# Patient Record
Sex: Female | Born: 1975 | Race: Black or African American | Hispanic: No | Marital: Single | Smoking: Never smoker
Health system: Southern US, Community
[De-identification: ages and names within clinical notes are randomized; demographics above are authoritative.]

## PROBLEM LIST (undated history)

## (undated) DIAGNOSIS — Z87440 Personal history of urinary (tract) infections: Secondary | ICD-10-CM

## (undated) DIAGNOSIS — G43909 Migraine, unspecified, not intractable, without status migrainosus: Secondary | ICD-10-CM

## (undated) DIAGNOSIS — Z862 Personal history of diseases of the blood and blood-forming organs and certain disorders involving the immune mechanism: Secondary | ICD-10-CM

## (undated) DIAGNOSIS — Z8742 Personal history of other diseases of the female genital tract: Secondary | ICD-10-CM

## (undated) DIAGNOSIS — Z8759 Personal history of other complications of pregnancy, childbirth and the puerperium: Secondary | ICD-10-CM

## (undated) HISTORY — DX: Personal history of urinary (tract) infections: Z87.440

## (undated) HISTORY — DX: Personal history of diseases of the blood and blood-forming organs and certain disorders involving the immune mechanism: Z86.2

## (undated) HISTORY — DX: Migraine, unspecified, not intractable, without status migrainosus: G43.909

## (undated) HISTORY — DX: Personal history of other complications of pregnancy, childbirth and the puerperium: Z87.59

## (undated) HISTORY — DX: Personal history of other diseases of the female genital tract: Z87.42

## (undated) HISTORY — PX: OTHER SURGICAL HISTORY: SHX169

---

## 1996-07-14 DIAGNOSIS — Z87448 Personal history of other diseases of urinary system: Secondary | ICD-10-CM | POA: Insufficient documentation

## 2007-11-27 ENCOUNTER — Emergency Department: Payer: Self-pay | Admitting: Emergency Medicine

## 2009-01-17 IMAGING — CR RIGHT HAND - COMPLETE 3+ VIEW
1 series · 3 of 3 positions shown · non-contrast
Comparison: No comparison

REASON FOR EXAM: injury  thumb   MC 1
COMMENTS:   LMP: depo

PROCEDURE:     DXR - DXR HAND RT COMPLETE W/OBLIQUES  - November 27, 2007 [DATE]
RESULT:     History: Thumb injury

[Series 1: view not recorded · 0.17mm/px · 3 of 3 slices shown]
[im 1/3]
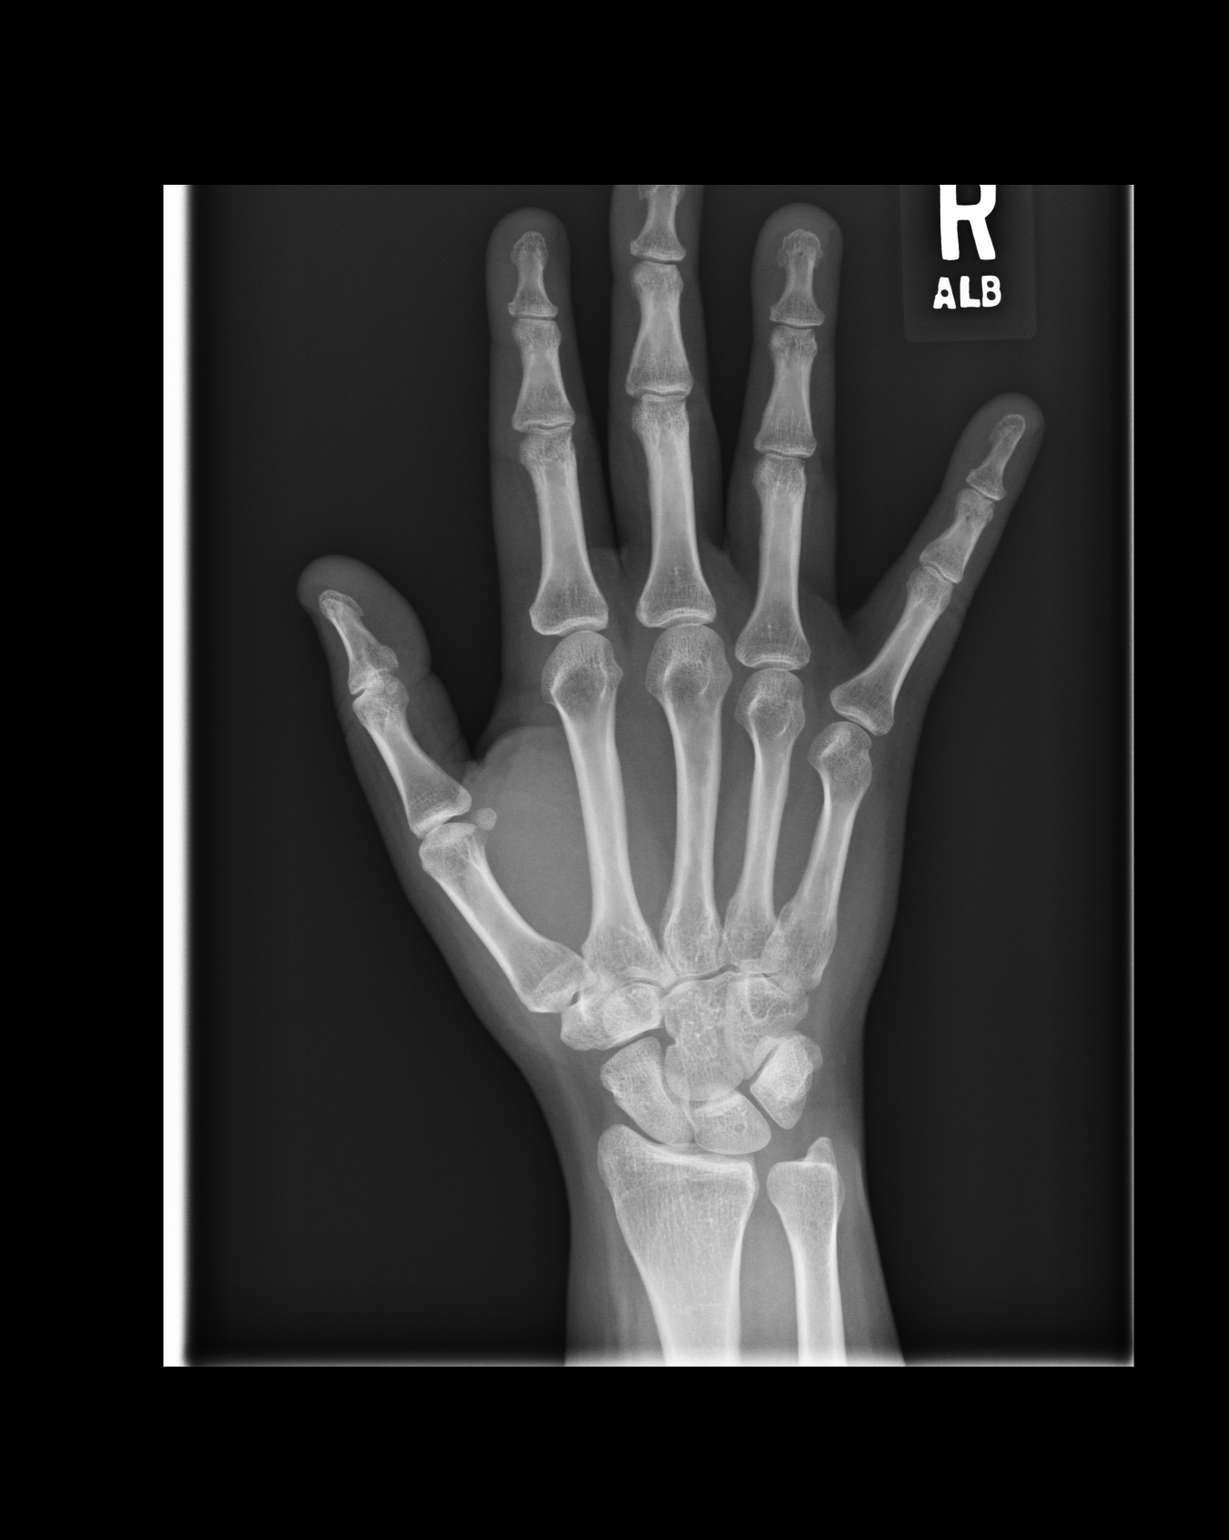
[im 2/3]
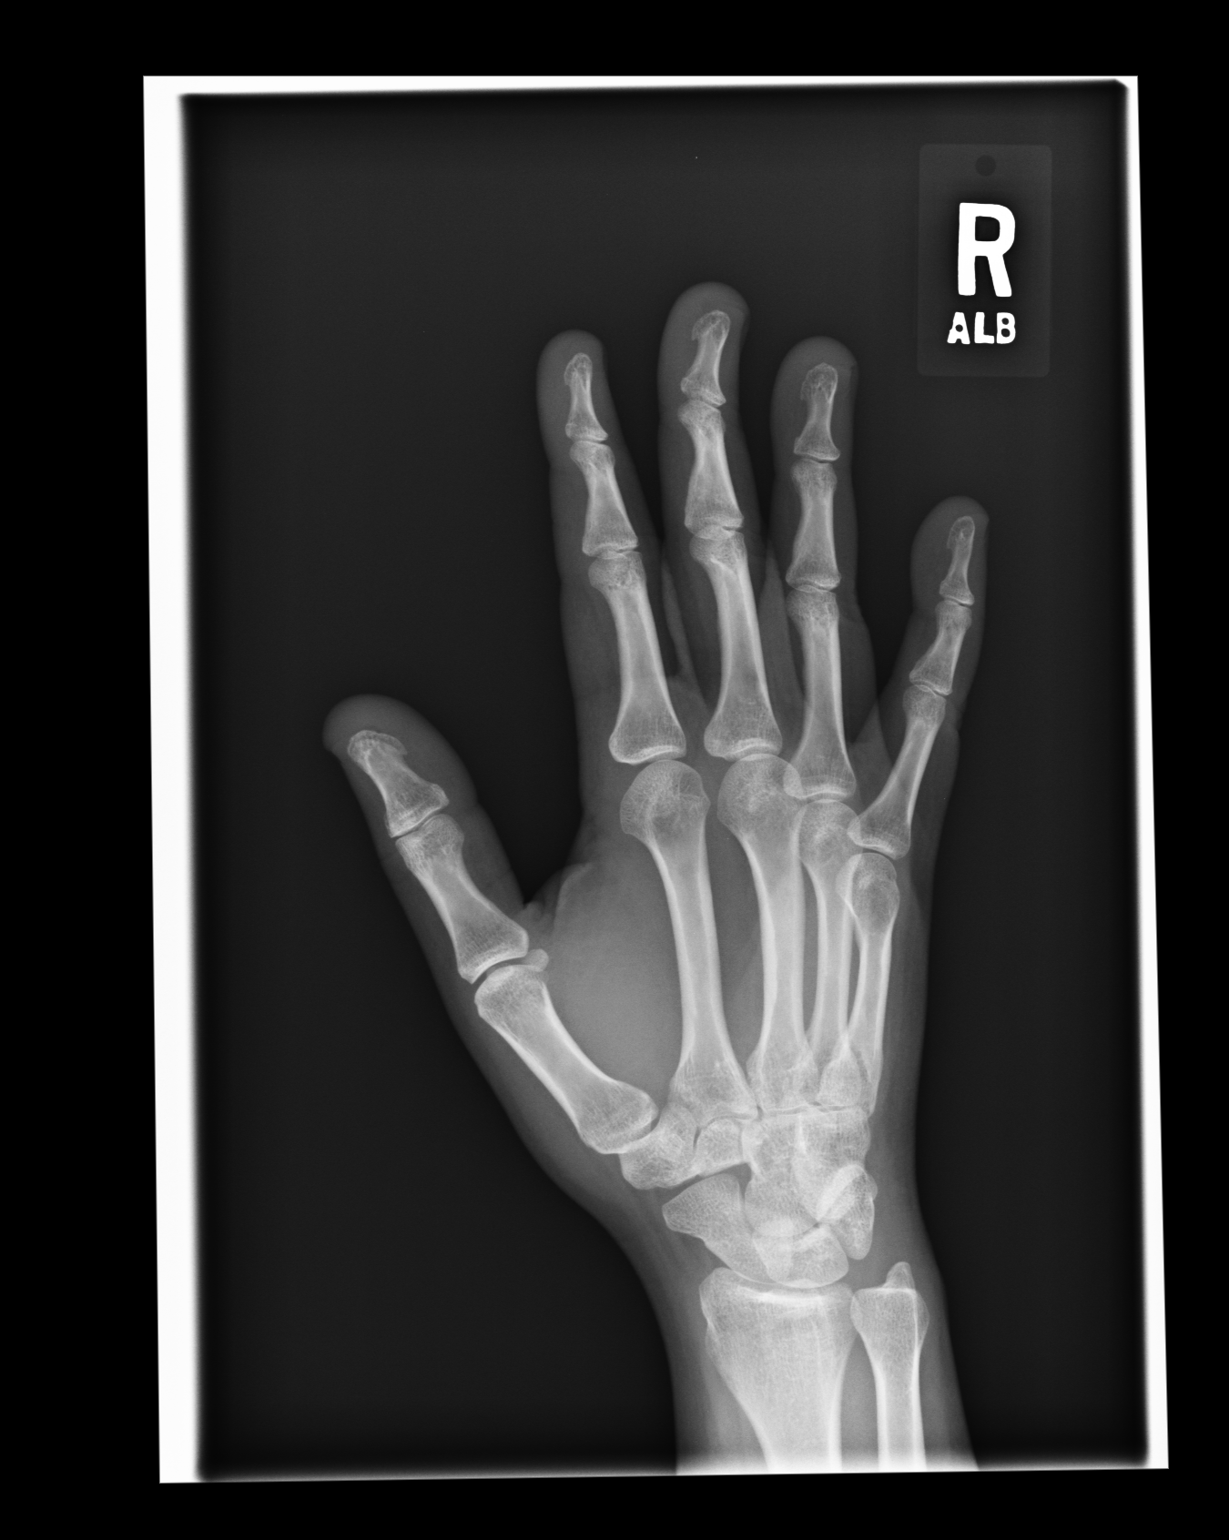
[im 3/3]
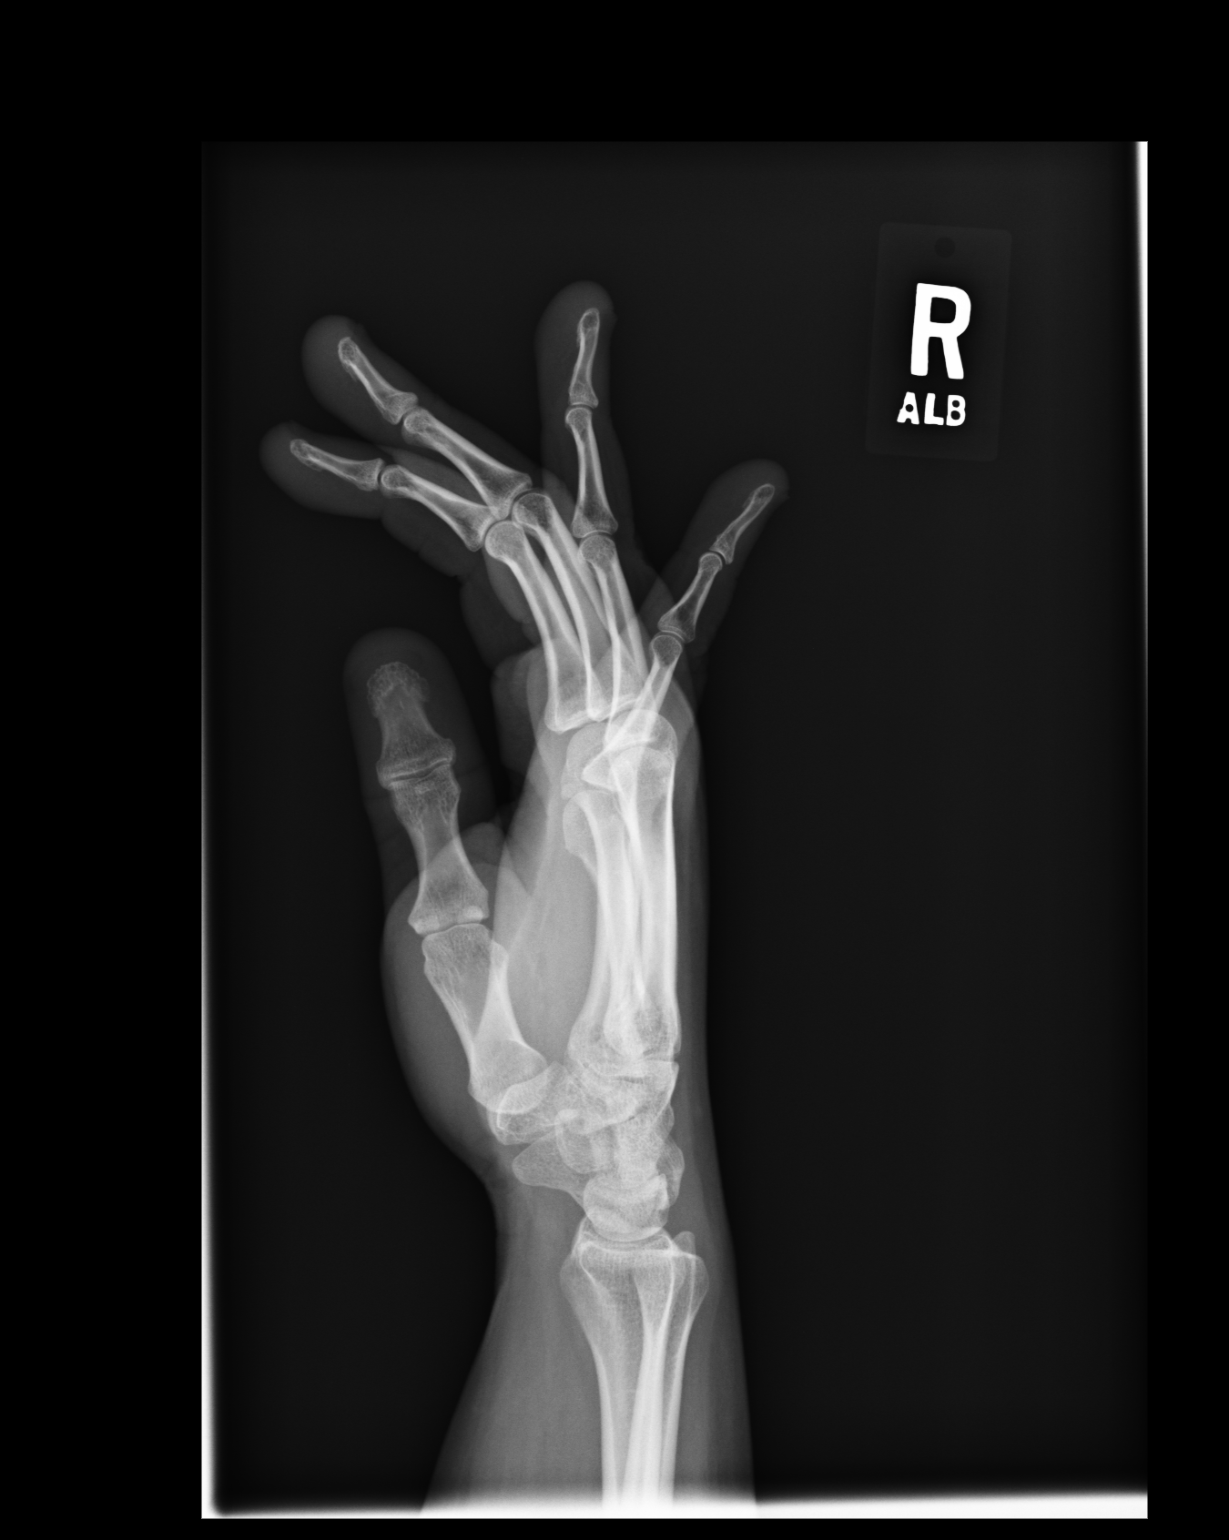

[3 of 3 positions shown; findings below may reference images not displayed]

FINDINGS: AP, oblique, and lateral views of the right hand demonstrates no fracture or
dislocation. There is normal bone mineralization. There are no erosive
changes. The joint spaces are maintained. There is no soft tissue swelling.
IMPRESSION: No acute osseous abnormality of the right hand.

## 2011-12-31 ENCOUNTER — Emergency Department: Payer: Self-pay | Admitting: Emergency Medicine

## 2011-12-31 LAB — URINALYSIS, COMPLETE
Bilirubin,UR: NEGATIVE
Blood: NEGATIVE
Ketone: NEGATIVE
Nitrite: NEGATIVE
Ph: 5 (ref 4.5–8.0)
RBC,UR: 2 /HPF (ref 0–5)

## 2011-12-31 LAB — COMPREHENSIVE METABOLIC PANEL
Albumin: 3.8 g/dL (ref 3.4–5.0)
Alkaline Phosphatase: 63 U/L (ref 50–136)
BUN: 11 mg/dL (ref 7–18)
Calcium, Total: 8.8 mg/dL (ref 8.5–10.1)
EGFR (Non-African Amer.): >60
Glucose: 57 mg/dL — ABNORMAL LOW (ref 65–99)
Osmolality: 280 (ref 275–301)
Potassium: 3.6 mmol/L (ref 3.5–5.1)
SGOT(AST): 19 U/L (ref 15–37)
SGPT (ALT): 16 U/L (ref 12–78)

## 2011-12-31 LAB — CBC
HCT: 42.1 % (ref 35.0–47.0)
MCH: 30.5 pg (ref 26.0–34.0)
MCHC: 33.8 g/dL (ref 32.0–36.0)
MCV: 90 fL (ref 80–100)
Platelet: 268 10*3/uL (ref 150–440)
RBC: 4.66 10*6/uL (ref 3.80–5.20)
RDW: 13.7 % (ref 11.5–14.5)
WBC: 3.5 10*3/uL — ABNORMAL LOW (ref 3.6–11.0)

## 2011-12-31 LAB — PREGNANCY, URINE: Pregnancy Test, Urine: NEGATIVE m[IU]/mL

## 2011-12-31 LAB — WET PREP, GENITAL

## 2012-01-01 LAB — URINE CULTURE

## 2014-02-13 ENCOUNTER — Emergency Department: Payer: Self-pay | Admitting: Emergency Medicine

## 2016-06-23 DIAGNOSIS — G43909 Migraine, unspecified, not intractable, without status migrainosus: Secondary | ICD-10-CM

## 2016-06-23 HISTORY — DX: Migraine, unspecified, not intractable, without status migrainosus: G43.909

## 2016-06-25 LAB — HM PAP SMEAR: HM PAP: NEGATIVE

## 2018-02-02 DIAGNOSIS — R030 Elevated blood-pressure reading, without diagnosis of hypertension: Secondary | ICD-10-CM | POA: Insufficient documentation

## 2018-02-02 DIAGNOSIS — E669 Obesity, unspecified: Secondary | ICD-10-CM | POA: Insufficient documentation

## 2018-02-02 LAB — HM HIV SCREENING LAB: HM HIV Screening: NEGATIVE

## 2018-06-04 DIAGNOSIS — Z6838 Body mass index (BMI) 38.0-38.9, adult: Secondary | ICD-10-CM

## 2018-06-04 DIAGNOSIS — E669 Obesity, unspecified: Secondary | ICD-10-CM

## 2018-06-04 DIAGNOSIS — Z87448 Personal history of other diseases of urinary system: Secondary | ICD-10-CM

## 2018-06-04 DIAGNOSIS — R030 Elevated blood-pressure reading, without diagnosis of hypertension: Secondary | ICD-10-CM

## 2018-10-25 ENCOUNTER — Telehealth: Payer: Self-pay | Admitting: Family Medicine

## 2018-10-26 NOTE — Telephone Encounter (Signed)
Returned patient phone call. Spoke with patient which is requesting to come to health department to "pick up more birth control patches." RN informed patient that we did not have in house birth control patches but that a provider would be able to write a Rx for patches. Patient informed RN that she had a current prescription with refills available. Patient stated she would continue with current birth control patch order. No further questions.

## 2019-02-08 ENCOUNTER — Other Ambulatory Visit: Payer: Self-pay | Admitting: Physician Assistant

## 2019-02-08 ENCOUNTER — Telehealth: Payer: Self-pay | Admitting: Family Medicine

## 2019-02-08 NOTE — Telephone Encounter (Signed)
RN consulted re: patient request for refill on Xulane patch.  Reviewed RN note and will call in [place] for patient with refills x 3.  Patient to RTC for RP/in person visit prior to refills running out.

## 2019-02-08 NOTE — Telephone Encounter (Signed)
Phone call to pt at phone # provided. Pt reports that she was here back in 05/2018 where they prescribed her the birth control patch. Pt reports that she had been skipping the patch free week so that she wouldn't have her periods as she gets anemic, and needs another rx as she is on her last patch. Pt reports she is not having any issues with the birth control patch and reports [facility] [address] in [place], [place]. Counseled pt I would discuss with the provider and give her a call back. Pt states understanding. Consulted with Antoine Primas, PA who states she can get rx sent in but that pt will need to call back prior to getting last rx so we can get her in for a physical. Phone call back to pt and counseled pt per Antoine Primas, PA and pt states understanding. Pt with no other questions or concerns at this time.Ronny Bacon, RN

## 2019-02-08 NOTE — Telephone Encounter (Signed)
pls call in ref to [facility] patch

## 2019-02-08 NOTE — Telephone Encounter (Signed)
Called in patch for 3 month supply to use continuous dosing with patch-free interval at week 13 with one refill instead of one month supply and patch free week 4 as previously prescribed.

## 2019-02-08 NOTE — Telephone Encounter (Signed)
Patient wants to talk to nurse or Dr Ernestina Patches.

## 2019-02-08 NOTE — Telephone Encounter (Signed)
[facility] [address].  Left message on provider voice mail that patient can have Xulane patch, 3 month supply apply one patch per week for 12 weeks, patch free for week 13 and repeat as directed with one refill.

## 2019-02-28 ENCOUNTER — Telehealth: Payer: Self-pay | Admitting: Family Medicine

## 2019-02-28 NOTE — Telephone Encounter (Signed)
Having bleeding with patch. Has been on patch for over a year.

## 2019-03-01 ENCOUNTER — Telehealth: Payer: Self-pay | Admitting: Family Medicine

## 2019-03-01 NOTE — Telephone Encounter (Signed)
See 03/01/19 phone note. Aileen Fass, RN

## 2019-03-01 NOTE — Telephone Encounter (Signed)
Called back, waiting on call from nurse 03/01/2019

## 2019-03-01 NOTE — Telephone Encounter (Signed)
TC with patient.  Uses BC patch as extended cycle but recently has had BTB. Inquired re: any irritation, itching, d/c. Patient reports irritation at opening of vagina. Patient requested appt to see provider for eval and possibly STD testing .Marland Kitchen Appt scheduled Aileen Fass, RN

## 2019-03-03 ENCOUNTER — Encounter: Payer: Self-pay | Admitting: Physician Assistant

## 2019-03-03 ENCOUNTER — Other Ambulatory Visit: Payer: Self-pay

## 2019-03-03 ENCOUNTER — Ambulatory Visit (LOCAL_COMMUNITY_HEALTH_CENTER): Payer: Medicaid Other | Admitting: Physician Assistant

## 2019-03-03 VITALS — BP 125/73 | Ht 64.0 in | Wt 221.0 lb

## 2019-03-03 DIAGNOSIS — Z3009 Encounter for other general counseling and advice on contraception: Secondary | ICD-10-CM

## 2019-03-03 DIAGNOSIS — Z3045 Encounter for surveillance of transdermal patch hormonal contraceptive device: Secondary | ICD-10-CM

## 2019-03-03 NOTE — Progress Notes (Signed)
S: pt presents for well-woman care and contraception. Declines STI testing except HIV/Syphilis. Consistent female partner. Using Xulane patch, primarily to avoid menses and for mood.  LMP 02/14/19, and still having some light bleeding. Prefers not to have menses. Nonsmoker without personal or family h/o clotting disorder/CVA. Last Pap 06/26/16. Last annual physical 01/2018. O: pleasant overweight woman in NAD. Breast exam: symmetrical large breasts without mass/erythema or skin changes. Nipples everted without discharge or tenderness. No axillary LAD. A: Contraceptive management. Counseled re: BCM via tiered approach, recommending estrogen-free LARCs. Pt elects to continue patch. P: continue Xulane (eRx done.)

## 2019-03-03 NOTE — Progress Notes (Addendum)
Here today for "yearly check up" and to discuss concerns with current birth control. Per [facility] last PE was 02/02/2018, last Pap was 06/26/2016, needs yearly breast exam.  Hal Morales, RN

## 2019-08-01 ENCOUNTER — Other Ambulatory Visit: Payer: Self-pay | Admitting: Physician Assistant

## 2019-08-02 ENCOUNTER — Telehealth: Payer: Self-pay | Admitting: Physician Assistant

## 2019-08-02 NOTE — Telephone Encounter (Signed)
Call to pharmacy at 5:35pm.  Received electronic and faxed refill request for patient for Xulane.  Reviewed chart and per chart, patient had in-person visit 02/2019 and per note refills were e-Rx at that time.  Per pharmacy staff, they do not have any record of electronic refills sent in November.  Verbal order given for patient to have Xulane 1 box apply 1 patch per week for 3 weeks, patch free week 4 and repeat with refills x 8, until annual visit due 02/2020, at which time patient will need in-person visit with pap.

## 2020-01-17 ENCOUNTER — Other Ambulatory Visit: Payer: Self-pay | Admitting: Physician Assistant

## 2020-01-17 NOTE — Telephone Encounter (Signed)
Per chart review, patient due for RP 02/2020.  Will OK one refill of 9 patches due to RP due 02/2020.  Patient will need in-person visit prior to further refills.

## 2020-03-21 ENCOUNTER — Other Ambulatory Visit: Payer: Self-pay | Admitting: Physician Assistant

## 2020-03-21 NOTE — Telephone Encounter (Signed)
Patient with last in-person visit 02/2019.  Refill oked for patient in 12/2019 since RP due 02/2020.  Patient needs in-person RP visit, so will ok one refill and patient needs to have visit prior to further refills.

## 2020-04-11 ENCOUNTER — Other Ambulatory Visit: Payer: Self-pay | Admitting: Physician Assistant

## 2020-04-13 ENCOUNTER — Ambulatory Visit (LOCAL_COMMUNITY_HEALTH_CENTER): Payer: BLUE CROSS/BLUE SHIELD | Admitting: Family Medicine

## 2020-04-13 ENCOUNTER — Encounter: Payer: Self-pay | Admitting: Family Medicine

## 2020-04-13 ENCOUNTER — Other Ambulatory Visit: Payer: Self-pay

## 2020-04-13 VITALS — BP 136/87 | Temp 98.3°F | Ht 64.0 in | Wt 221.2 lb

## 2020-04-13 DIAGNOSIS — Z3009 Encounter for other general counseling and advice on contraception: Secondary | ICD-10-CM | POA: Diagnosis not present

## 2020-04-13 DIAGNOSIS — Z113 Encounter for screening for infections with a predominantly sexual mode of transmission: Secondary | ICD-10-CM

## 2020-04-13 DIAGNOSIS — Z01419 Encounter for gynecological examination (general) (routine) without abnormal findings: Secondary | ICD-10-CM

## 2020-04-13 DIAGNOSIS — R7303 Prediabetes: Secondary | ICD-10-CM | POA: Diagnosis not present

## 2020-04-13 DIAGNOSIS — R87613 High grade squamous intraepithelial lesion on cytologic smear of cervix (HGSIL): Secondary | ICD-10-CM | POA: Diagnosis not present

## 2020-04-13 DIAGNOSIS — A599 Trichomoniasis, unspecified: Secondary | ICD-10-CM

## 2020-04-13 LAB — WET PREP FOR TRICH, YEAST, CLUE
Trichomonas Exam: POSITIVE — AB
Yeast Exam: NEGATIVE

## 2020-04-13 MED ORDER — METRONIDAZOLE 500 MG PO TABS: 500.0000 mg | ORAL_TABLET | Freq: Two times a day (BID) | ORAL | 0 refills | Status: AC

## 2020-04-13 MED ORDER — XULANE 150-35 MCG/24HR TD PTWK: 1.0000 | MEDICATED_PATCH | TRANSDERMAL | 12 refills | Status: AC

## 2020-04-13 NOTE — Progress Notes (Signed)
LMP 04/04/2020 (normal). Patch due to be re-applied on 04/09/2020 but forgot to apply until 04/12/2020. Currently with some light vaginal bleeding. Intercourse x2 since 04/04/2020 with condom. Last sex 04/09/2020 with condom. Jossie Ng, RN  Treated for trich per standing order. Jossie Ng, RN

## 2020-04-13 NOTE — Progress Notes (Signed)
[facility] [address] [place], [place] [address] Main Number: [phone]  Family Planning Visit- Initial Visit  Subjective:  Tracy Chambers is a 44 y.o.  [id number]  being seen today for an initial well woman visit and to discuss family planning options. Patient reports they do not want a pregnancy in the next year.   Chief Complaint  Patient presents with  . Annual Exam  . Contraception    Pt has Elevated blood pressure reading without diagnosis of hypertension; Obesity; and History of pyelonephritis on their problem list.   HPI  Patient reports she is here for PE, refill of patch Xulane, and pap. States she has been using patch x 3 years, works well for her and she'd like to continue. She gets heavy periods, results in anemia - because of this she cycles patch. She has tried other [facility] - Depo causes too much bleeding; OCPs difficult to remember; her daughter had excess bleeding with Nexplanon; she is worried about procedure to put in IUDs. She has borderline BP today and "elevated BP reading without diagnosis of HTN" on her problem list, though states no diagnosis of HTN and believes BP slightly increased (though not >140/90) today d/t recent argument with partner.   Pt does not meet the following contraindications specific to the contraceptive patch: -Current treatment for hepatitis C with rx containing ombitasvir/paritaprevir/ritonavir -History of sensitive skin or exfoliative dermatologic disorders (relative contraindication) Though pt DOES meet contraindication of BMI>30  Pt also does not meet any of the following contraindications specific to estrogen containing contraceptives: -Age ?35 years and smoking ?15 cigarettes per day -Migraine with aura -Two or more RF for arterial CVD (such as older age, smoking, diabetes, and hypertension) -HTN -Breast cancer or estrogen dependent tumor -VTE hx or acute event -Known  thrombogenic mutations -Known ischemic heart disease -History of stroke -Complicated valvular heart disease (pulmonary HTN, risk for afib, hx subacute bacterial endocarditis) -Cirrhosis, Hepatocellular adenoma or malignant hepatoma    Patient's last menstrual period was 04/04/2020 (within days). Last sex: 12/13 BCM: condom + patch  Pt desires EC? n/a  Last pap per pt/review of record: 05/2016 = ASCUS, hpv -.  Updated in Epic Care Gaps. Last HIV test per pt/review of record: 02/2019 Last tetanus vaccine: 1998  Last breast exam: 02/2019 Personal/family hx breast cancer? no  Patient reports 1 partner(s) in last year. Do they desire STI screening (if no, why not)? yes  Does the patient desire a pregnancy in the next year? no   44 y.o., Body mass index is 37.97 kg/m. - Is patient eligible for HA1C diabetes screening based on BMI and age >27?  yes  HCV screening;       Has patient been screened once for HCV in the past?  no  No results found for: HCVAB      Does the patient have current drug use, have a partner with drug use, and/or has been incarcerated since last result? no If yes-- Screen for HCV through [facility]   Does the patient meet criteria for HBV testing? no Criteria:  -Household, sexual or needle sharing contact with HBV -History of drug use -HIV positive -Those with known Hep C  PHQ-2 score 0   See flowsheet for other program required questions.   Health Maintenance Due  Topic Date Due  . Hepatitis C Screening  Never done  . COVID-19 Vaccine (1) Never done  . TETANUS/TDAP  Never done  .  PAP SMEAR-Modifier  06/26/2019  . INFLUENZA VACCINE  Never done    ROS 10 point review of systems is otherwise negative except as mentioned in HPI and listed below: Anemia: associated w/menstrual bleeding  The following portions of the patient's history were reviewed and updated as appropriate: allergies, current medications, past family history, past medical history,  past social history, past surgical history and problem list. Problem list updated.   See flowsheet for other program required questions.  Objective:   Vitals:   04/13/20 1449  BP: 136/87  Temp: 98.3 F (36.8 C)  Weight: 221 lb 3.2 oz (100.3 kg)  Height: 5\' 4"  (1.626 m)    BP Readings from Last 3 Encounters:  04/13/20 136/87  03/03/19 125/73  02/02/18 126/86     Physical Exam Vitals and nursing note reviewed.  Constitutional:      Appearance: Normal appearance.  HENT:     Head: Normocephalic and atraumatic.     Mouth/Throat:     Mouth: Mucous membranes are moist.     Pharynx: Oropharynx is clear. No oropharyngeal exudate or posterior oropharyngeal erythema.  Eyes:     Conjunctiva/sclera: Conjunctivae normal.  Neck:     Thyroid: No thyroid mass, thyromegaly or thyroid tenderness.  Cardiovascular:     Rate and Rhythm: Normal rate and regular rhythm.     Pulses: Normal pulses.     Heart sounds: Normal heart sounds.  Pulmonary:     Effort: Pulmonary effort is normal.     Breath sounds: Normal breath sounds.  Abdominal:     General: Abdomen is flat.     Palpations: There is no mass.     Tenderness: There is no abdominal tenderness. There is no rebound.  Genitourinary:    General: Normal vulva.     Exam position: Lithotomy position.     Pubic Area: No rash or pubic lice.      Labia:            Right: No rash or lesion.            Left: No rash or lesion.      Vagina: Normal. No vaginal erythema, or lesions. Vaginal discharge: white  +menstrual blood in vault    Cervix: No cervical motion tenderness, discharge, friability, lesion or erythema.     Uterus: Normal.      Adnexa: Right adnexa normal and left adnexa normal.     Rectum: Normal.  Lymphadenopathy:     Head:     Right side of head: No preauricular or posterior auricular adenopathy.     Left side of head: No preauricular or posterior auricular adenopathy.     Cervical: No cervical adenopathy.     Upper  Body:     Right upper body: No supraclavicular or axillary adenopathy.     Left upper body: No supraclavicular or axillary adenopathy.     Lower Body: No right inguinal adenopathy. No left inguinal adenopathy.  Skin:    General: Skin is warm and dry.     Findings: No rash.  Neurological:     Mental Status: She is alert and oriented to person, place, and time.      Assessment and Plan:  Tracy Chambers is a 44 y.o. female presenting to the [facility] Department for an initial well woman exam/family planning visit.  Contraception counseling: Reviewed all forms of birth control options in the tiered based approach. available including abstinence; over the counter/barrier methods; hormonal contraceptive medication  including pill, patch, ring, injection,contraceptive implant, ECP; hormonal and nonhormonal IUDs; permanent sterilization options including vasectomy and the various tubal sterilization modalities. Risks, benefits, and typical effectiveness rates were reviewed.  Questions were answered.  Written information was also given to the patient to review.  Patient desires patch, this was prescribed for patient. She will follow up in  1 year for surveillance.  She was told to call with any further questions, or with any concerns about this method of contraception.  Emphasized use of condoms 100% of the time for STI prevention.  Emergency Contraception: n/a  1. Well woman exam -BCM: I discussed with Dr. Alvester Morin. Due to contraindication of patch for thromboembolism risk of BMI >30 (BMI 37 today) in the setting of BP borderline, age 68 and preference for cycling patches (rather than monthly withdrawal bleed) I strongly suggested progesterone only form of BCM and discussed that the patch was contraindicated for her. After extensive discussion pt states she understands risks and definitively declines all other forms of birth control, stating the patch is the only thing that works for her. She  is open to switch to using patches as prescribed with monthly bleed (as UpToDate cites controversy over increased thrombosis risk with cycling), though I advised pt to RTC if menses is too heavy and anemia returns. We reviewed s/sx of blood clots, pt to go to ER if present. Additionally, Bedsider.org website given to pt, advised to check out progesterone only methods we discussed if she changes her mind.  -Pap: done today -CBE: done 1 yr ago. "Active FYIs" info up to date. Recommended screening mammograms beginning at age 6 -A1c screening: pt qualifies and accepts -Recommended PCP f/u for anemia and wt gain f/u - IGP, Aptima HPV - norelgestromin-ethinyl estradiol [facility]) 150-35 MCG/24HR transdermal patch; Place 1 patch onto the skin once a week.  Dispense: 3 patch; Refill: 12  2. Screening examination for venereal disease -Pt without symptoms. Screenings today as below. Treat wet prep per standing order. -Patient does meet criteria for HepC Screening. Accepts these screenings. -Counseled on warning s/sx and when to seek care. Recommended condom use with all sex and discussed importance of condom use for STI prevention. - WET PREP FOR TRICH, YEAST, CLUE - Chlamydia/Gonorrhea [facility] - Syphilis Serology, [facility] - HIV/HCV [facility]     Return in about 1 year (around 04/13/2021) for yearly wellness exam.  No future appointments.  Ann Held, PA-C

## 2020-04-14 LAB — HGB A1C W/O EAG: Hgb A1c MFr Bld: 5.7 % — ABNORMAL HIGH (ref 4.8–5.6)

## 2020-04-17 NOTE — Progress Notes (Signed)
Attestation of Attending Supervision of Advanced Practitioner: Evaluation and management procedures were performed by the Advanced Practioner with my collaboration.  I have reviewed the PAs note and chart, and I agree with the management and plan. I was consulted and the documentation reflects our discussion.   Luke Falero Niles Sheridan Gettel, MD, MPH, ABFM Medical Director [facility]  

## 2020-04-18 LAB — HM HEPATITIS C SCREENING LAB: HM Hepatitis Screen: NEGATIVE

## 2020-04-18 LAB — HM HIV SCREENING LAB: HM HIV Screening: NEGATIVE

## 2020-04-19 LAB — IGP, APTIMA HPV
HPV Aptima: POSITIVE — AB
PAP Smear Comment: 0

## 2020-04-24 DIAGNOSIS — R7303 Prediabetes: Secondary | ICD-10-CM | POA: Insufficient documentation

## 2020-04-24 DIAGNOSIS — R87613 High grade squamous intraepithelial lesion on cytologic smear of cervix (HGSIL): Secondary | ICD-10-CM | POA: Insufficient documentation

## 2020-04-24 NOTE — Addendum Note (Signed)
Addended by: Geanie Berlin on: 04/24/2020 10:21 AM   Modules accepted: Orders

## 2020-04-26 ENCOUNTER — Telehealth: Payer: Self-pay

## 2020-04-26 NOTE — Telephone Encounter (Signed)
Telephone call to patient today regarding her PAP results (HSIL and + HPV) and the need for a Colpo referral.  She reports having insurance coverage and would like to be referred to [facility].  Colpo referral completed today. Hart Carwin, RN

## 2020-05-01 ENCOUNTER — Telehealth: Payer: Self-pay | Admitting: Obstetrics & Gynecology

## 2020-05-01 NOTE — Telephone Encounter (Signed)
[facility] referring for Colpo. Called and spoke with patient about scheduling appointment. Patient reports she has Family planning medicaid but also has another insurance. Patient is currently at work and wish to call back to be scheduled.

## 2020-05-07 ENCOUNTER — Telehealth: Payer: Self-pay | Admitting: Family Medicine

## 2020-05-07 NOTE — Telephone Encounter (Signed)
Per client, unable to have colpo at [facility] as problem with her insurance network. Client counseled to notify Hart Carwin RN and phone number provided. Jossie Ng, RN

## 2020-05-07 NOTE — Telephone Encounter (Signed)
Patient just stated needed to talk to nurse/provider that seen her last time she was here.

## 2020-05-08 ENCOUNTER — Telehealth: Payer: Self-pay

## 2020-05-08 NOTE — Telephone Encounter (Signed)
Per an inbox message from Tresa Moore at [facility], patient insurance is out of network and cannot be referred to [facility] for her Colpo unless she wants to pay out of pocket. Hart Carwin, RN      Telephone call to patient today and patient has opted to be referred to the [facility] clinic for her Colpo.  Colpo referral completed and records faxed to [facility] today with confirmation. Hart Carwin, RN

## 2020-06-07 ENCOUNTER — Telehealth: Payer: Self-pay

## 2020-06-07 NOTE — Telephone Encounter (Signed)
Telephone call to [facility] to inquire about Colpo referral/appointment.  Patient has a Colpo appointment for 07/11/2020 at 10:15 am. Hart Carwin, RN

## 2020-06-26 ENCOUNTER — Ambulatory Visit: Payer: BLUE CROSS/BLUE SHIELD

## 2020-06-27 ENCOUNTER — Telehealth: Payer: Self-pay

## 2020-06-27 NOTE — Telephone Encounter (Signed)
Call to patient. Patient reports she has an appointment with [facility] ( unsure of [facility]). Rn will make sure patient has referral if needed. Will notify pap nurse, Hart Carwin RN. Harvie Heck, RN

## 2020-06-27 NOTE — Telephone Encounter (Signed)
RN spoke with Tracy Chambers at [facility] about patient. Patient now has [employer] and no loner qualifies for [employer] referral. Patient [employer] is also out of network for [employer]. RN called to patient to inform her of this information and to see if we can find a private practice that she could use her insurance for her colpo referral. No answer. LMOM to call RN back at [phone]. Will notify Hart Carwin RN of this change.   Harvie Heck, RN

## 2020-06-28 ENCOUNTER — Telehealth: Payer: Self-pay

## 2020-06-28 ENCOUNTER — Ambulatory Visit: Payer: BLUE CROSS/BLUE SHIELD | Admitting: Obstetrics and Gynecology

## 2020-06-28 NOTE — Telephone Encounter (Signed)
Telephone call to patient today and she told me she would have to give me a call back that she was at work. Hart Carwin, RN

## 2020-06-28 NOTE — Telephone Encounter (Signed)
Telephone call to Maralyn Sago at [facility] today to inquire with patient being covered by [facility], would she be considered in network or not for Colpo?  Left message for Maralyn Sago to please return my call. Hart Carwin, RN

## 2020-06-28 NOTE — Telephone Encounter (Signed)
Return call by Verner Mould from [facility] today.  Patient already cancelled her appointment today 06/28/2020.  Hart Carwin, RN

## 2020-07-04 ENCOUNTER — Telehealth: Payer: Self-pay | Admitting: Family Medicine

## 2020-07-04 NOTE — Telephone Encounter (Signed)
Returning Becky's call about a referall to another Dr

## 2020-09-14 ENCOUNTER — Encounter: Payer: Self-pay | Admitting: Nurse Practitioner

## 2020-09-14 NOTE — Progress Notes (Signed)
Telephone call to patient regarding PAP follow up process.  Patient states that she has been in communication with [facility] regarding her decision surrounding a LEEP.  Patient encouraged to contact [facility] once she has made a decision about the LEEP procedure. Glenna Fellows, RN
